# Patient Record
Sex: Female | Born: 1985 | Hispanic: No | Marital: Single | Smoking: Never smoker
Health system: Southern US, Community
[De-identification: ages and names within clinical notes are randomized; demographics above are authoritative.]

## PROBLEM LIST (undated history)

## (undated) DIAGNOSIS — B159 Hepatitis A without hepatic coma: Secondary | ICD-10-CM

## (undated) DIAGNOSIS — N200 Calculus of kidney: Secondary | ICD-10-CM

## (undated) HISTORY — PX: KNEE SURGERY: SHX244

---

## 2015-07-30 ENCOUNTER — Emergency Department (HOSPITAL_COMMUNITY): Payer: Non-veteran care

## 2015-07-30 ENCOUNTER — Emergency Department (HOSPITAL_COMMUNITY)
Admission: EM | Admit: 2015-07-30 | Discharge: 2015-07-30 | Disposition: A | Discharge status: Home / Self Care | Payer: Non-veteran care | Attending: Emergency Medicine | Admitting: Emergency Medicine

## 2015-07-30 ENCOUNTER — Encounter (HOSPITAL_COMMUNITY): Payer: Self-pay | Admitting: Emergency Medicine

## 2015-07-30 DIAGNOSIS — R200 Anesthesia of skin: Secondary | ICD-10-CM | POA: Diagnosis present

## 2015-07-30 DIAGNOSIS — R252 Cramp and spasm: Secondary | ICD-10-CM | POA: Insufficient documentation

## 2015-07-30 DIAGNOSIS — Z79899 Other long term (current) drug therapy: Secondary | ICD-10-CM | POA: Insufficient documentation

## 2015-07-30 HISTORY — DX: Hepatitis a without hepatic coma: B15.9

## 2015-07-30 HISTORY — DX: Calculus of kidney: N20.0

## 2015-07-30 LAB — CBC WITH DIFFERENTIAL/PLATELET
BASOS PCT: 1 %
Basophils Absolute: 0 10*3/uL (ref 0.0–0.1)
Eosinophils Absolute: 0.2 10*3/uL (ref 0.0–0.7)
Eosinophils Relative: 3 %
HEMATOCRIT: 43.2 % (ref 36.0–46.0)
HEMOGLOBIN: 14.5 g/dL (ref 12.0–15.0)
LYMPHS ABS: 1.5 10*3/uL (ref 0.7–4.0)
Lymphocytes Relative: 33 %
MCH: 29.6 pg (ref 26.0–34.0)
MCHC: 33.6 g/dL (ref 30.0–36.0)
MCV: 88.2 fL (ref 78.0–100.0)
MONOS PCT: 6 %
Monocytes Absolute: 0.3 10*3/uL (ref 0.1–1.0)
NEUTROS ABS: 2.5 10*3/uL (ref 1.7–7.7)
NEUTROS PCT: 57 %
Platelets: 284 10*3/uL (ref 150–400)
RBC: 4.9 MIL/uL (ref 3.87–5.11)
RDW: 13.5 % (ref 11.5–15.5)
WBC: 4.4 10*3/uL (ref 4.0–10.5)

## 2015-07-30 LAB — MAGNESIUM: Magnesium: 2 mg/dL (ref 1.7–2.4)

## 2015-07-30 LAB — BASIC METABOLIC PANEL
ANION GAP: 5 (ref 5–15)
BUN: 12 mg/dL (ref 6–20)
CHLORIDE: 101 mmol/L (ref 101–111)
CO2: 31 mmol/L (ref 22–32)
CREATININE: 0.7 mg/dL (ref 0.44–1.00)
Calcium: 9.2 mg/dL (ref 8.9–10.3)
GFR calc non Af Amer: >60 mL/min (ref >60)
Glucose, Bld: 74 mg/dL (ref 65–99)
POTASSIUM: 3.8 mmol/L (ref 3.5–5.1)
Sodium: 137 mmol/L (ref 135–145)

## 2015-07-30 LAB — URINALYSIS, ROUTINE W REFLEX MICROSCOPIC
BILIRUBIN URINE: NEGATIVE
Glucose, UA: NEGATIVE mg/dL
Hgb urine dipstick: NEGATIVE
KETONES UR: NEGATIVE mg/dL
LEUKOCYTES UA: NEGATIVE
NITRITE: NEGATIVE
PROTEIN: NEGATIVE mg/dL
Specific Gravity, Urine: 1.01 (ref 1.005–1.030)
pH: 6.5 (ref 5.0–8.0)

## 2015-07-30 LAB — PREGNANCY, URINE: PREG TEST UR: NEGATIVE

## 2015-07-30 LAB — CK: CK TOTAL: 412 U/L — AB (ref 38–234)

## 2015-07-30 MED ORDER — METHOCARBAMOL 500 MG PO TABS: 1000.0000 mg | ORAL_TABLET | Freq: Four times a day (QID) | ORAL | Status: AC | PRN

## 2015-07-30 MED ORDER — SODIUM CHLORIDE 0.9 % IV BOLUS (SEPSIS)
1000.0000 mL | Freq: Once | INTRAVENOUS | Status: AC
  Administered 2015-07-30: 1000 mL via INTRAVENOUS

## 2015-07-30 MED ORDER — OXYCODONE-ACETAMINOPHEN 5-325 MG PO TABS: ORAL_TABLET | ORAL | Status: AC

## 2015-07-30 MED ORDER — HYDROMORPHONE HCL 1 MG/ML IJ SOLN
1.0000 mg | Freq: Once | INTRAMUSCULAR | Status: AC
  Administered 2015-07-30: 1 mg via INTRAVENOUS
  Filled 2015-07-30: qty 1

## 2015-07-30 MED ORDER — MORPHINE SULFATE (PF) 2 MG/ML IV SOLN
2.0000 mg | INTRAVENOUS | Status: DC | PRN
  Administered 2015-07-30: 2 mg via INTRAVENOUS
  Filled 2015-07-30 (×2): qty 1

## 2015-07-30 MED ORDER — DOXYCYCLINE HYCLATE 100 MG PO TABS: 100.0000 mg | ORAL_TABLET | Freq: Two times a day (BID) | ORAL | Status: AC

## 2015-07-30 NOTE — ED Notes (Signed)
Patient walked to the bathroom with minimal assistance.  

## 2015-07-30 NOTE — ED Notes (Signed)
Pt c/o continuing spasms to lower legs.

## 2015-07-30 NOTE — ED Provider Notes (Signed)
CSN: [id number]     Arrival date & time 07/30/15  1114 History   First MD Initiated Contact with Patient 07/30/15 1220     Chief Complaint  Patient presents with  . Numbness  . Spasms      HPI  Pt was seen at 1225. Per pt, c/o gradual onset and persistence of constant bilat LE's "cramping, tingling and numbness" that began 2 to 3 days ago. Describes her symptoms as located in her feet, calfs and quadricepts muscles. Pt states these symptoms began after 2 to 3 days of N/V/D. Pt states her N/V/D has resolved. Also endorses "doing a really hard workout" before her N/V/D began. Denies fevers, no focal motor weakness, no saddle anesthesia, no incont/retention of bowel or bladder, no back pain, no abd pain, no CP/SOB.     Past Medical History  Diagnosis Date  . Kidney stones    Past Surgical History  Procedure Laterality Date  . Knee surgery      Social History  Substance Use Topics  . Smoking status: Never Smoker   . Smokeless tobacco: None  . Alcohol Use: Yes     Comment: socially    Review of Systems ROS: Statement: All systems negative except as marked or noted in the HPI; Constitutional: Negative for fever and chills. ; ; Eyes: Negative for eye pain, redness and discharge. ; ; ENMT: Negative for ear pain, hoarseness, nasal congestion, sinus pressure and sore throat. ; ; Cardiovascular: Negative for chest pain, palpitations, diaphoresis, dyspnea and peripheral edema. ; ; Respiratory: Negative for cough, wheezing and stridor. ; ; Gastrointestinal: Negative for nausea, vomiting, diarrhea, abdominal pain, blood in stool, hematemesis, jaundice and rectal bleeding. . ; ; Genitourinary: Negative for dysuria, flank pain and hematuria. ; ; Musculoskeletal: +bilat LE's muscles cramping and tingling. Negative for back pain and neck pain. Negative for swelling and trauma.; ; Skin: Negative for pruritus, rash, abrasions, blisters, bruising and skin lesion.; ; Neuro: Negative for headache,  lightheadedness and neck stiffness. Negative for weakness, altered level of consciousness, altered mental status, extremity weakness, involuntary movement, seizure and syncope.      Allergies  Review of patient's allergies indicates no known allergies.  Home Medications   Prior to Admission medications   Medication Sig Start Date End Date Taking? Authorizing Provider  ondansetron (ZOFRAN) 4 MG tablet Take 4 mg by mouth every 8 (eight) hours as needed for nausea or vomiting.   Yes Historical Provider, MD   BP 131/74 mmHg  Pulse 76  Temp(Src) 98.1 F (36.7 C) (Oral)  Resp 14  Ht 5\' 1"  (1.549 m)  Wt 115 lb (52.164 kg)  BMI 21.74 kg/m2  SpO2 100%  LMP 07/24/2015 Physical Exam  1230: Physical examination:  Nursing notes reviewed; Vital signs and O2 SAT reviewed;  Constitutional: Well developed, Well nourished, Well hydrated, Uncomfortable appearing; Head:  Normocephalic, atraumatic; Eyes: EOMI, PERRL, No scleral icterus; ENMT: Mouth and pharynx normal, Mucous membranes moist; Neck: Supple, Full range of motion, No lymphadenopathy; Cardiovascular: Regular rate and rhythm, No murmur, rub, or gallop; Respiratory: Breath sounds clear & equal bilaterally, No rales, rhonchi, wheezes.  Speaking full sentences with ease, Normal respiratory effort/excursion; Chest: Nontender, Movement normal; Abdomen: Soft, Nontender, Nondistended, Normal bowel sounds; Genitourinary: No CVA tenderness; Extremities: Pulses normal, No deformity. LE's muscles compartments soft. No edema, No calf edema or asymmetry.; Neuro: AA&Ox3, Major CN grossly intact.  Speech clear. Strength 5/5 equal bilat LE's. DTR 2/4 equal bilat LE's. No gross focal motor or  sensory deficits in extremities. Climbs on and off stretcher easily by herself. Gait steady.; Skin: Color normal, Warm, Dry.   ED Course  Procedures (including critical care time) Labs Review  Imaging Review  I have personally reviewed and evaluated these images and lab  results as part of my medical decision-making.   EKG Interpretation None      MDM  MDM Reviewed: previous chart, nursing note and vitals Reviewed previous: labs Interpretation: labs   Results for orders placed or performed during the hospital encounter of 07/30/15  CBC with Differential  Result Value Ref Range   WBC 4.4 4.0 - 10.5 K/uL   RBC 4.90 3.87 - 5.11 MIL/uL   Hemoglobin 14.5 12.0 - 15.0 g/dL   HCT 16.1 09.6 - 04.5 %   MCV 88.2 78.0 - 100.0 fL   MCH 29.6 26.0 - 34.0 pg   MCHC 33.6 30.0 - 36.0 g/dL   RDW 40.9 81.1 - 91.4 %   Platelets 284 150 - 400 K/uL   Neutrophils Relative % 57 %   Neutro Abs 2.5 1.7 - 7.7 K/uL   Lymphocytes Relative 33 %   Lymphs Abs 1.5 0.7 - 4.0 K/uL   Monocytes Relative 6 %   Monocytes Absolute 0.3 0.1 - 1.0 K/uL   Eosinophils Relative 3 %   Eosinophils Absolute 0.2 0.0 - 0.7 K/uL   Basophils Relative 1 %   Basophils Absolute 0.0 0.0 - 0.1 K/uL  Basic metabolic panel  Result Value Ref Range   Sodium 137 135 - 145 mmol/L   Potassium 3.8 3.5 - 5.1 mmol/L   Chloride 101 101 - 111 mmol/L   CO2 31 22 - 32 mmol/L   Glucose, Bld 74 65 - 99 mg/dL   BUN 12 6 - 20 mg/dL   Creatinine, Ser 7.82 0.44 - 1.00 mg/dL   Calcium 9.2 8.9 - 95.6 mg/dL   GFR calc non Af Amer >60 >60 mL/min   GFR calc Af Amer >60 >60 mL/min   Anion gap 5 5 - 15  Magnesium  Result Value Ref Range   Magnesium 2.0 1.7 - 2.4 mg/dL  CK  Result Value Ref Range   Total CK 412 (H) 38 - 234 U/L  Pregnancy, urine  Result Value Ref Range   Preg Test, Ur NEGATIVE NEGATIVE  Urinalysis, Routine w reflex microscopic  Result Value Ref Range   Color, Urine YELLOW YELLOW   APPearance CLEAR CLEAR   Specific Gravity, Urine 1.010 1.005 - 1.030   pH 6.5 5.0 - 8.0   Glucose, UA NEGATIVE NEGATIVE mg/dL   Hgb urine dipstick NEGATIVE NEGATIVE   Bilirubin Urine NEGATIVE NEGATIVE   Ketones, ur NEGATIVE NEGATIVE mg/dL   Protein, ur NEGATIVE NEGATIVE mg/dL   Nitrite NEGATIVE NEGATIVE    Leukocytes, UA NEGATIVE NEGATIVE   Mr Brain Wo Contrast (neuro Protocol) 07/30/2015  CLINICAL DATA:  Progressive BILATERAL leg weakness and tingling for the past 4 days. No known injury. EXAM: MRI HEAD WITHOUT CONTRAST TECHNIQUE: Multiplanar, multiecho pulse sequences of the brain and surrounding structures were obtained without intravenous contrast. COMPARISON:  MRI cervical spine reported separately. FINDINGS: No evidence for acute infarction, hemorrhage, mass lesion, hydrocephalus, or extra-axial fluid. Normal for age cerebral volume. Small foci of subcortical white matter signal abnormality, T2 and FLAIR hyperintense are observed in the supratentorial region. Corpus callosum and periventricular regions appear unaffected. Considerations include chronic infection, vasculitis, complicated migraine, or idiopathic. Demyelinating disease is not favored given the distribution. Flow voids are  maintained throughout the carotid, basilar, and vertebral arteries. There are no areas of chronic hemorrhage. Pituitary, pineal, and cerebellar tonsils unremarkable. No upper cervical lesions. Visualized calvarium, skull base, and upper cervical osseous structures unremarkable. Scalp and extracranial soft tissues, orbits, sinuses, and mastoids show no acute process. IMPRESSION: No acute intracranial abnormality. No cause is seen for the reported symptoms. Non acute, nonspecific, small foci of supratentorial subcortical white matter signal abnormality. See discussion above. Electronically Signed   By: Elsie StainJohn T Curnes M.D.   On: 07/30/2015 19:10   Mr Cervical Spine Wo Contrast 07/30/2015  CLINICAL DATA:  Progressive BILATERAL leg weakness and tingling for 4 days. Evaluate for demyelinating disease. EXAM: MRI CERVICAL SPINE WITHOUT CONTRAST TECHNIQUE: Multiplanar, multisequence MR imaging of the cervical spine was performed. No intravenous contrast was administered. COMPARISON:  MRI brain reported separately FINDINGS: Alignment:  Anatomic Vertebrae: No fracture, evidence of discitis, or bone lesion. Cord: Normal signal.  Slight flattening at C5-6, described below. Posterior Fossa, vertebral arteries, paraspinal tissues: Negative. Disc levels: C2-3:  Normal. C3-4:  Normal. C4-5:  Annular bulge.  No impingement. C5-6: Central protrusion. Slight effacement anterior subarachnoid space. No foraminal narrowing. Slight cord indentation. No abnormal cord signal. No significant spinal stenosis. C6-7: Central protrusion. Slight effacement anterior subarachnoid space. No impingement. C7-T1:  Normal. IMPRESSION: Central protrusion at C5-6 results in slight cord indentation. No abnormal cord signal. No significant spinal stenosis. No intrinsic cord abnormality is seen to suggest demyelinating disease. Electronically Signed   By: Elsie StainJohn T Curnes M.D.   On: 07/30/2015 19:15   [place] Venous Img Lower Bilateral 07/30/2015  CLINICAL DATA:  Cramping and tingling in both lower extremities for 4 days EXAM: BILATERAL LOWER EXTREMITY VENOUS DOPPLER ULTRASOUND TECHNIQUE: Gray-scale sonography with graded compression, as well as color Doppler and duplex ultrasound were performed to evaluate the lower extremity deep venous systems from the level of the common femoral vein and including the common femoral, femoral, profunda femoral, popliteal and calf veins including the posterior tibial, peroneal and gastrocnemius veins when visible. The superficial great saphenous vein was also interrogated. Spectral Doppler was utilized to evaluate flow at rest and with distal augmentation maneuvers in the common femoral, femoral and popliteal veins. COMPARISON:  None FINDINGS: RIGHT LOWER EXTREMITY Common Femoral Vein: No evidence of thrombus. Normal compressibility, respiratory phasicity and response to augmentation. Saphenofemoral Junction: No evidence of thrombus. Normal compressibility and flow on color Doppler imaging. Profunda Femoral Vein: No evidence of thrombus. Normal  compressibility and flow on color Doppler imaging. Femoral Vein: No evidence of thrombus. Normal compressibility, respiratory phasicity and response to augmentation. Popliteal Vein: No evidence of thrombus. Normal compressibility, respiratory phasicity and response to augmentation. Calf Veins: No evidence of thrombus. Normal compressibility and flow on color Doppler imaging. Superficial Great Saphenous Vein: No evidence of thrombus. Normal compressibility and flow on color Doppler imaging. Venous Reflux:  None. Other Findings:  None. LEFT LOWER EXTREMITY Common Femoral Vein: No evidence of thrombus. Normal compressibility, respiratory phasicity and response to augmentation. Saphenofemoral Junction: No evidence of thrombus. Normal compressibility and flow on color Doppler imaging. Profunda Femoral Vein: No evidence of thrombus. Normal compressibility and flow on color Doppler imaging. Femoral Vein: No evidence of thrombus. Normal compressibility, respiratory phasicity and response to augmentation. Popliteal Vein: No evidence of thrombus. Normal compressibility, respiratory phasicity and response to augmentation. Calf Veins: No evidence of thrombus. Normal compressibility and flow on color Doppler imaging. Superficial Great Saphenous Vein: No evidence of thrombus. Normal compressibility and flow on color Doppler  imaging. Venous Reflux:  None. Other Findings:  None. IMPRESSION: No evidence of deep venous thrombosis in either lower extremity. Electronically Signed   By: Ulyses Southward M.D.   On: 07/30/2015 15:34     1515:  Pt ambulatory several times while in the ED without distress. States pain med "worked a little," pt no longer rubbing her legs while laying on stretcher. Muscles compartments continue soft, DTR 2/4 bilat LE's, no gross sensory or motor deficits. T/C to Neuro Dr. Gerilyn Pilgrim, case discussed, including:  HPI, pertinent PM/SHx, VS/PE, dx testing, ED course and treatment:  Doubt GBS without motor weakness,  agrees with workup, add MRI brain and CS without contrast given pt's age will need to r/o MS as cause for symptoms.  1600:  Pt and her friend have been on the phone talking to multiple family members. Now recall they were recently "in a grassy area where there were a lot of ticks" several weeks ago and are concerned regarding Lyme and RMSF; however, cannot recall tick bite. I offered to tx with doxycycline. They request labs for Lyme and RMSF, aware they will not result today. Both tests ordered.   2000:  MRI with incidental finding as above. Workup otherwise reassuring. Pt has been ambulatory around the ED with steady gait, easy resps, NAD. Pt more comfortable, ready to go home now. T/C to Neuro Dr. Gerilyn Pilgrim, case discussed, including:  HPI, pertinent PM/SHx, VS/PE, dx testing, ED course and treatment:  No other testing needed at this time, agrees tx with doxycycline for possible tick exposure, have pt f/u in office next week. Dx and testing, as well as d/w Neuro MD, d/w pt and family.  Questions answered.  Verb understanding, agreeable to d/c home with outpt f/u.   Samuel Jester, DO 08/02/15 1456

## 2015-07-30 NOTE — Discharge Instructions (Signed)
Take the prescriptions as directed.  Increase your fluid intake (ie:  Gatoraide) for the next few days, as discussed.  Your brain MRI showed an incidental finding of: "Non acute, nonspecific, small foci of supratentorial subcortical white matter signal abnormality."  Call the Neurologist tomorrow to schedule a follow up appointment within the next 3 days. Call your regular medical doctor tomorrow to schedule a follow up appointment for next week.  Return to the Emergency Department immediately sooner if worsening.

## 2015-07-30 NOTE — ED Notes (Addendum)
Patient [facility] States those symptoms have resolved but has started having bilateral leg "tingling and numbing sensation" since Monday. Patient ambulatory at triage. Patient states symptoms started at her feet on Monday that has gradually radiated up her legs each day.

## 2015-07-31 LAB — B. BURGDORFI ANTIBODIES: B burgdorferi Ab IgG+IgM: <0.91 {ISR} (ref 0.00–0.90)

## 2015-07-31 LAB — ROCKY MTN SPOTTED FVR ABS PNL(IGG+IGM)
RMSF IgG: NEGATIVE
RMSF IgM: 0.46 {index} (ref 0.00–0.89)

## 2017-12-24 IMAGING — MR MR HEAD W/O CM
10 of 12 series · 32 of 48 positions shown · non-contrast
Comparison: MRI cervical spine reported separately.

CLINICAL DATA: Progressive BILATERAL leg weakness and tingling for
the past 4 days. No known injury.

EXAM:
MRI HEAD WITHOUT CONTRAST
TECHNIQUE: Multiplanar, multiecho pulse sequences of the brain and surrounding
structures were obtained without intravenous contrast.

[Series 4: T1 · sagittal · 5.0mm · 0.41mm/px · 1 of 21 slices shown (1 of 2)]
[im 1/21]
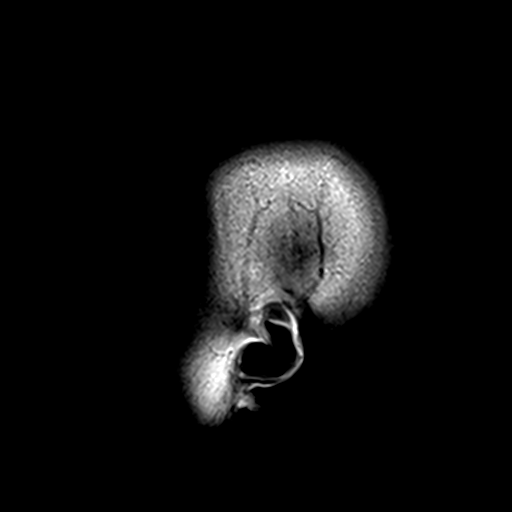

[Series 6: T2 · axial · 5.0mm · 0.46mm/px · z∈[+63,+206]mm · 2 of 23 slices shown (1 of 2)]
[im 1/23]
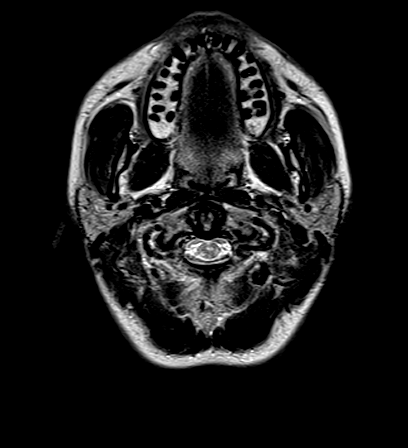
[im 23/23]
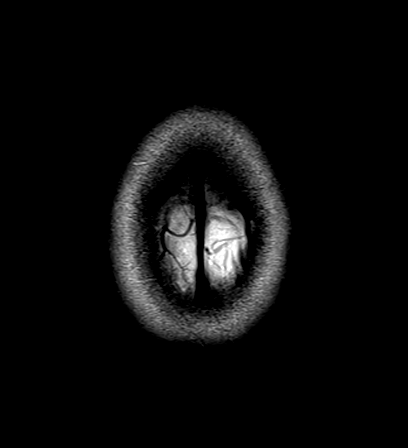

[Series 7: FLAIR · axial · 5.0mm · 0.32mm/px · z∈[+63,+206]mm · 2 of 23 slices shown]
[im 1/23]
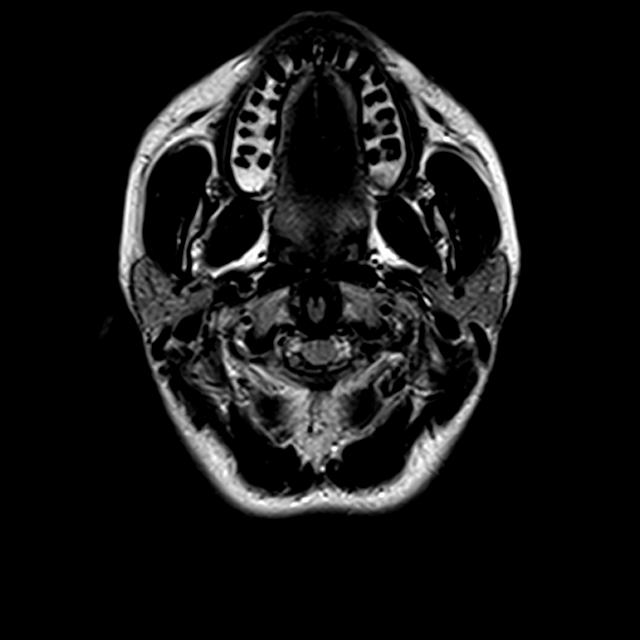
[im 23/23]
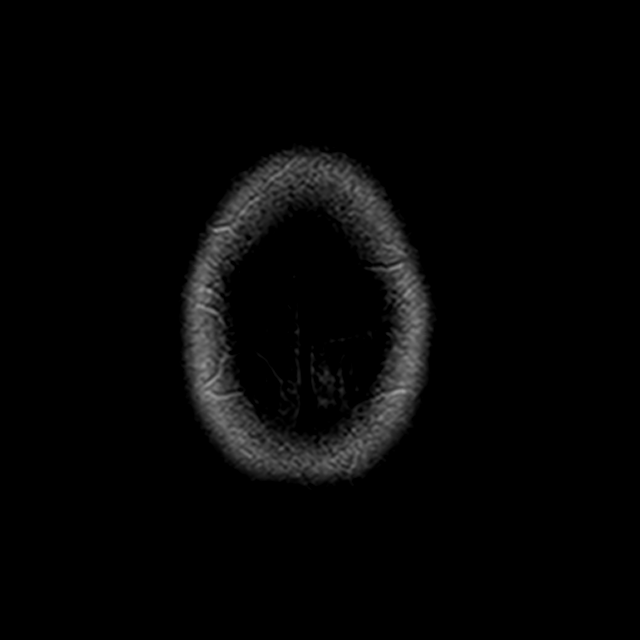

[Series 8: T1 · axial · 2.0mm · 0.41mm/px · z∈[+59,+213]mm · 7 of 78 slices shown (2 of 2)]
[im 1/78]
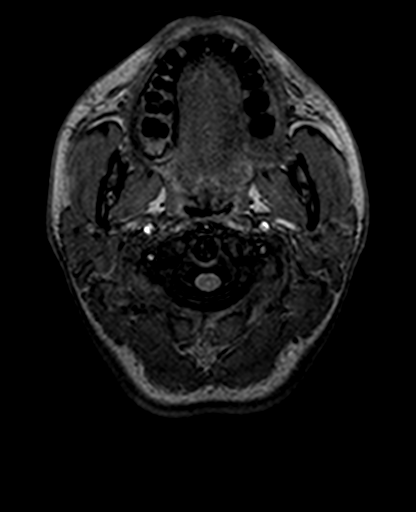
[im 13/78]
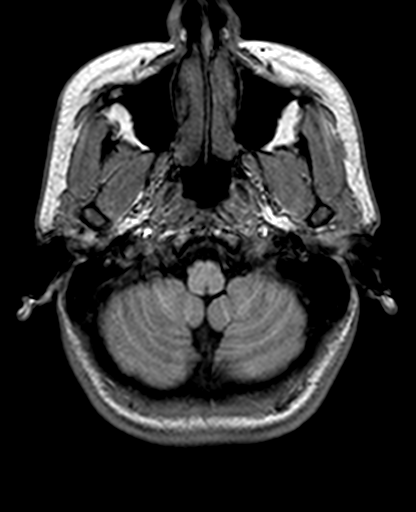
[im 26/78]
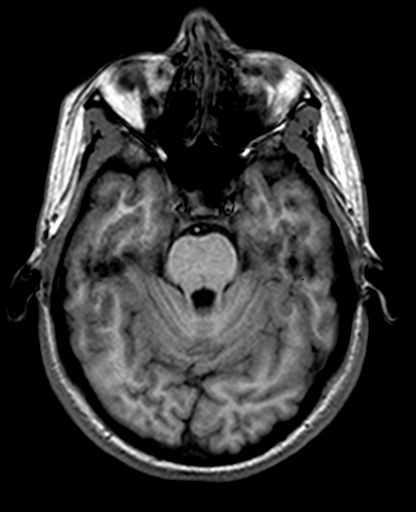
[im 39/78]
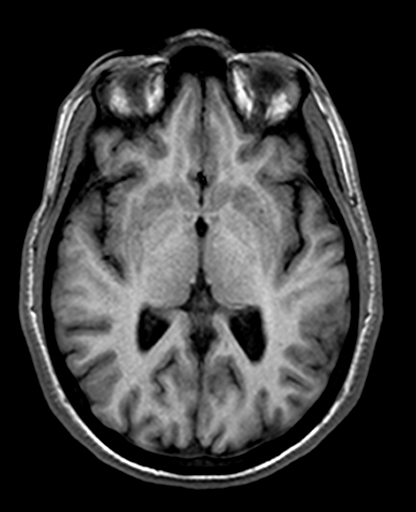
[im 52/78]
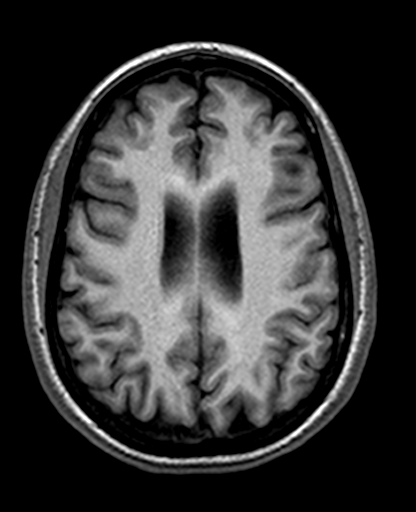
[im 65/78]
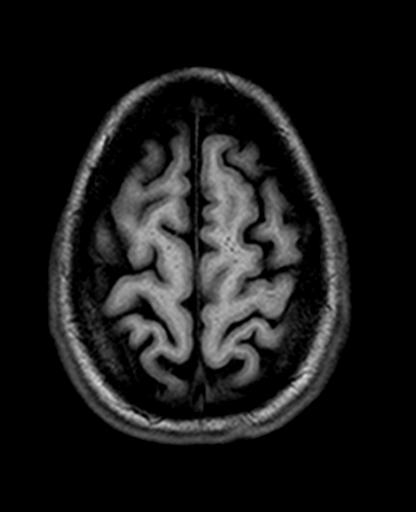
[im 78/78]
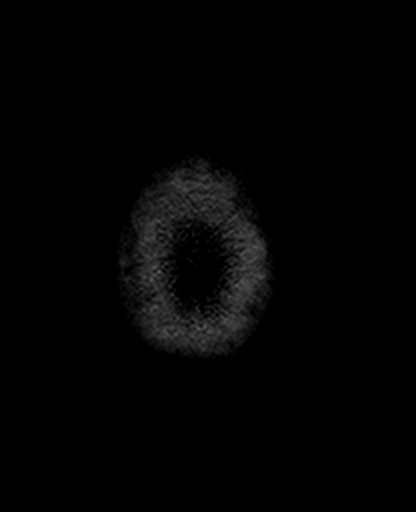

[Series 9: trauma axial · axial · 5.0mm · 0.40mm/px · z∈[+63,+206]mm · 2 of 23 slices shown]
[im 1/23]
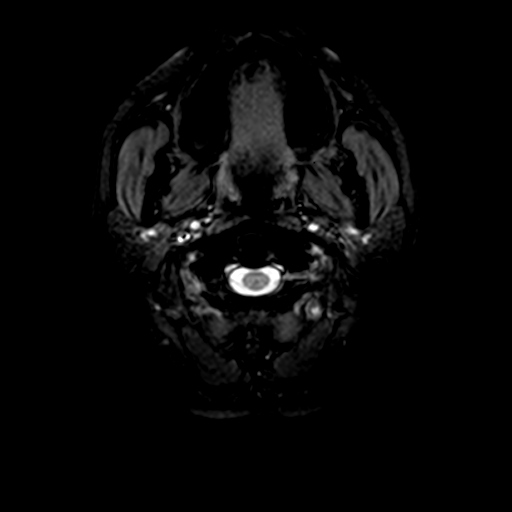
[im 23/23]
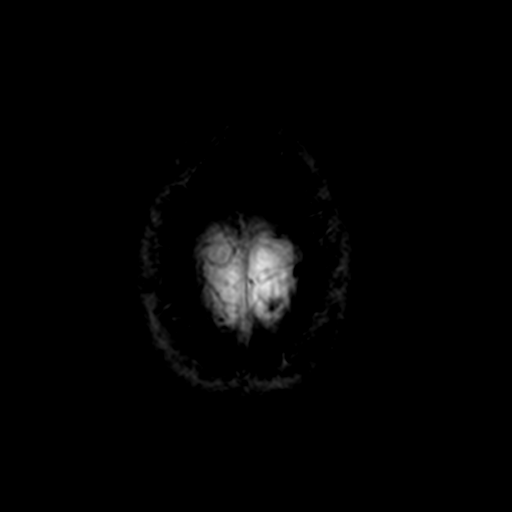

[Series 10: T2 · coronal · 5.0mm · 0.44mm/px · 2 of 28 slices shown (2 of 2)]
[im 1/28]
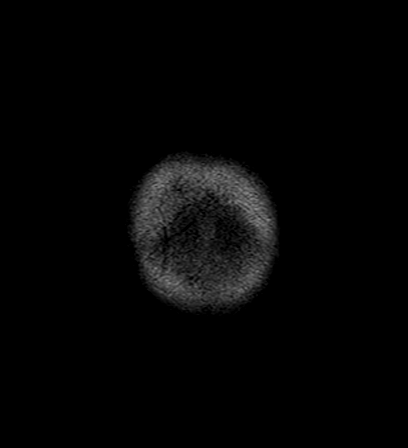
[im 28/28]
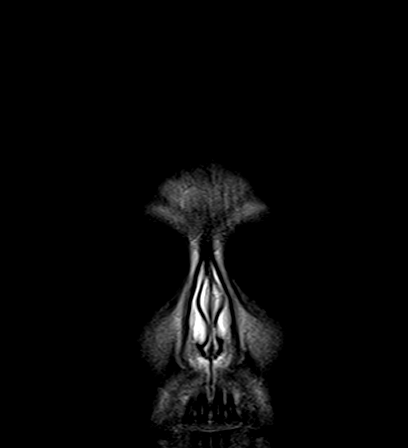

[Series 100: <mpr thick range> · axial · 3.0mm · 0.69mm/px · z∈[+72,+211]mm · 4 of 47 slices shown]
[im 1/47]
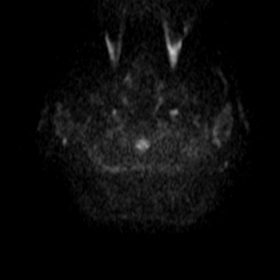
[im 16/47]
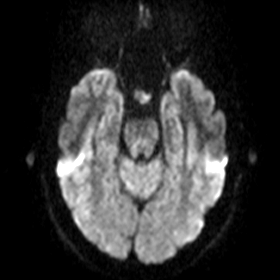
[im 31/47]
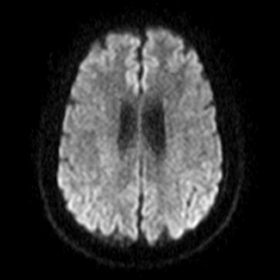
[im 47/47]
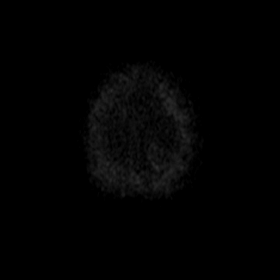

[Series 101: <mpr thick range(1)> · coronal · 3.0mm · 0.62mm/px · 5 of 58 slices shown]
[im 1/58]
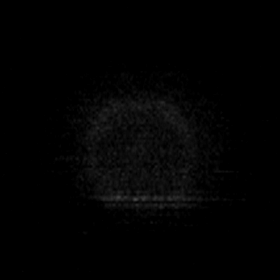
[im 15/58]
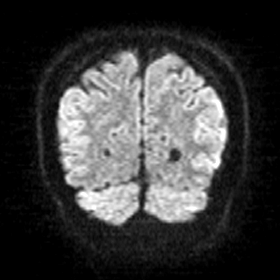
[im 29/58]
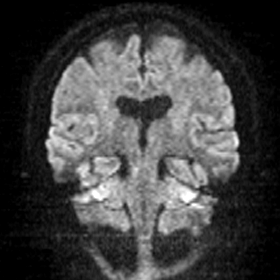
[im 43/58]
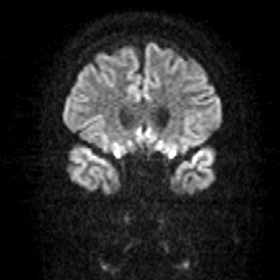
[im 58/58]
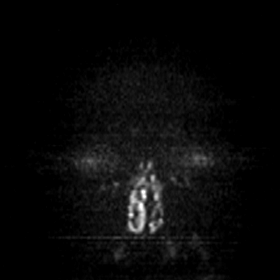

[Series 102: <mpr thick range(2)> · axial · 3.0mm · 0.69mm/px · z∈[+72,+211]mm · 4 of 47 slices shown]
[im 1/47]
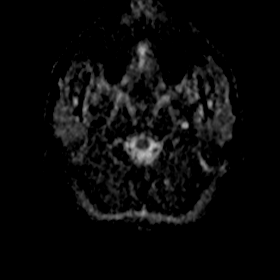
[im 16/47]
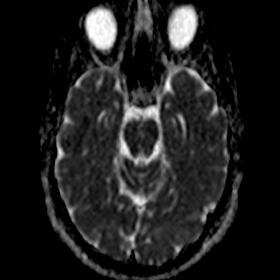
[im 31/47]
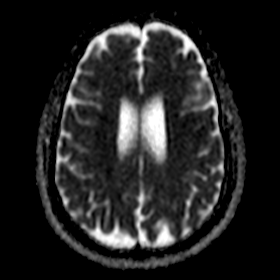
[im 47/47]
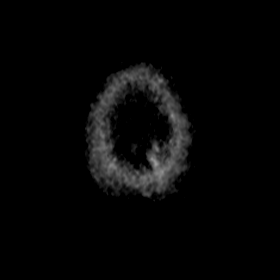

[Series 103: <mpr thick range(3)> · coronal · 3.0mm · 0.62mm/px · 3 of 58 slices shown]
[im 1/58]
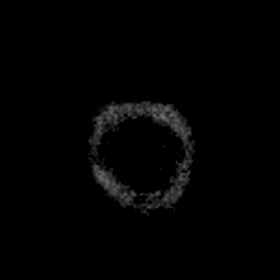
[im 15/58]
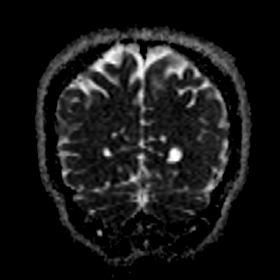
[im 29/58]
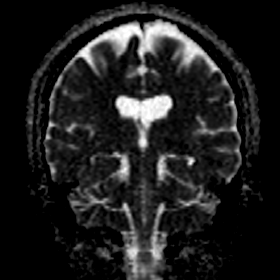

[32 of 48 positions shown; findings below may reference images not displayed]

FINDINGS: No evidence for acute infarction, hemorrhage, mass lesion,
hydrocephalus, or extra-axial fluid. Normal for age cerebral volume.

Small foci of subcortical white matter signal abnormality, T2 and
FLAIR hyperintense are observed in the supratentorial region. Corpus
callosum and periventricular regions appear unaffected.
Considerations include chronic infection, vasculitis, complicated
migraine, or idiopathic. Demyelinating disease is not favored given
the distribution.

Flow voids are maintained throughout the carotid, basilar, and
vertebral arteries. There are no areas of chronic hemorrhage.

Pituitary, pineal, and cerebellar tonsils unremarkable. No upper
cervical lesions.

Visualized calvarium, skull base, and upper cervical osseous
structures unremarkable. Scalp and extracranial soft tissues,
orbits, sinuses, and mastoids show no acute process.
IMPRESSION: No acute intracranial abnormality. No cause is seen for the reported
symptoms.

Non acute, nonspecific, small foci of supratentorial subcortical
white matter signal abnormality. See discussion above.
